# Patient Record
Sex: Female | Born: 1960 | Race: White | Hispanic: No | Marital: Single
Health system: Southern US, Community
[De-identification: ages and names within clinical notes are randomized; demographics above are authoritative.]

---

## 2014-04-07 ENCOUNTER — Inpatient Hospital Stay: Payer: Self-pay | Admitting: Internal Medicine

## 2014-04-07 LAB — CK TOTAL AND CKMB (NOT AT ARMC)
CK, Total: 3011 U/L — ABNORMAL HIGH (ref 26–192)
CK-MB: 9.9 ng/mL — AB (ref 0.5–3.6)

## 2014-04-07 LAB — TROPONIN I
TROPONIN-I: 0.34 ng/mL — AB
Troponin-I: 0.36 ng/mL — ABNORMAL HIGH

## 2014-04-07 LAB — COMPREHENSIVE METABOLIC PANEL
ALK PHOS: 93 U/L
ANION GAP: 9 (ref 7–16)
Albumin: 3.4 g/dL (ref 3.4–5.0)
BUN: 39 mg/dL — ABNORMAL HIGH (ref 7–18)
Bilirubin,Total: 1 mg/dL (ref 0.2–1.0)
CALCIUM: 9.6 mg/dL (ref 8.5–10.1)
CHLORIDE: 107 mmol/L (ref 98–107)
CO2: 25 mmol/L (ref 21–32)
Creatinine: 1.89 mg/dL — ABNORMAL HIGH (ref 0.60–1.30)
GFR CALC AF AMER: 36 — AB
GFR CALC NON AF AMER: 30 — AB
Glucose: 152 mg/dL — ABNORMAL HIGH (ref 65–99)
OSMOLALITY: 294 (ref 275–301)
Potassium: 4.9 mmol/L (ref 3.5–5.1)
SGOT(AST): 81 U/L — ABNORMAL HIGH (ref 15–37)
SGPT (ALT): 67 U/L — ABNORMAL HIGH
SODIUM: 141 mmol/L (ref 136–145)
Total Protein: 7.7 g/dL (ref 6.4–8.2)

## 2014-04-07 LAB — DRUG SCREEN, URINE
Amphetamines, Ur Screen: NEGATIVE (ref ?–1000)
BARBITURATES, UR SCREEN: NEGATIVE (ref ?–200)
BENZODIAZEPINE, UR SCRN: NEGATIVE (ref ?–200)
Cannabinoid 50 Ng, Ur ~~LOC~~: NEGATIVE (ref ?–50)
Cocaine Metabolite,Ur ~~LOC~~: NEGATIVE (ref ?–300)
MDMA (ECSTASY) UR SCREEN: NEGATIVE (ref ?–500)
Methadone, Ur Screen: NEGATIVE (ref ?–300)
Opiate, Ur Screen: POSITIVE (ref ?–300)
Phencyclidine (PCP) Ur S: NEGATIVE (ref ?–25)
TRICYCLIC, UR SCREEN: NEGATIVE (ref ?–1000)

## 2014-04-07 LAB — CBC
HCT: 47.6 % — ABNORMAL HIGH (ref 35.0–47.0)
HGB: 15.8 g/dL (ref 12.0–16.0)
MCH: 30.9 pg (ref 26.0–34.0)
MCHC: 33.1 g/dL (ref 32.0–36.0)
MCV: 93 fL (ref 80–100)
Platelet: 228 10*3/uL (ref 150–440)
RBC: 5.11 10*6/uL (ref 3.80–5.20)
RDW: 13.7 % (ref 11.5–14.5)
WBC: 5.7 10*3/uL (ref 3.6–11.0)

## 2014-04-07 LAB — URINALYSIS, COMPLETE
Glucose,UR: NEGATIVE mg/dL (ref 0–75)
Hyaline Cast: 35
Leukocyte Esterase: NEGATIVE
NITRITE: NEGATIVE
PH: 5 (ref 4.5–8.0)
Protein: 100
Specific Gravity: 1.028 (ref 1.003–1.030)
Squamous Epithelial: NONE SEEN

## 2014-04-07 LAB — CK-MB
CK-MB: 10.1 ng/mL — AB (ref 0.5–3.6)
CK-MB: 13.2 ng/mL — ABNORMAL HIGH (ref 0.5–3.6)
CK-MB: 14 ng/mL — ABNORMAL HIGH (ref 0.5–3.6)

## 2014-04-07 LAB — ETHANOL

## 2014-04-08 DIAGNOSIS — R0602 Shortness of breath: Secondary | ICD-10-CM

## 2014-04-08 LAB — CBC WITH DIFFERENTIAL/PLATELET
Basophil #: 0 10*3/uL (ref 0.0–0.1)
Basophil %: 0.4 %
EOS ABS: 0.1 10*3/uL (ref 0.0–0.7)
Eosinophil %: 1.6 %
HCT: 40.3 % (ref 35.0–47.0)
HGB: 13.7 g/dL (ref 12.0–16.0)
LYMPHS PCT: 16.5 %
Lymphocyte #: 0.8 10*3/uL — ABNORMAL LOW (ref 1.0–3.6)
MCH: 31.3 pg (ref 26.0–34.0)
MCHC: 33.9 g/dL (ref 32.0–36.0)
MCV: 92 fL (ref 80–100)
Monocyte #: 0.8 x10 3/mm (ref 0.2–0.9)
Monocyte %: 15.8 %
NEUTROS PCT: 65.7 %
Neutrophil #: 3.2 10*3/uL (ref 1.4–6.5)
Platelet: 176 10*3/uL (ref 150–440)
RBC: 4.37 10*6/uL (ref 3.80–5.20)
RDW: 13.8 % (ref 11.5–14.5)
WBC: 4.8 10*3/uL (ref 3.6–11.0)

## 2014-04-08 LAB — COMPREHENSIVE METABOLIC PANEL
ALT: 58 U/L
AST: 128 U/L — AB (ref 15–37)
Albumin: 2.7 g/dL — ABNORMAL LOW (ref 3.4–5.0)
Alkaline Phosphatase: 77 U/L
Anion Gap: 7 (ref 7–16)
BUN: 34 mg/dL — ABNORMAL HIGH (ref 7–18)
Bilirubin,Total: 0.8 mg/dL (ref 0.2–1.0)
CHLORIDE: 111 mmol/L — AB (ref 98–107)
CREATININE: 1.41 mg/dL — AB (ref 0.60–1.30)
Calcium, Total: 9.5 mg/dL (ref 8.5–10.1)
Co2: 25 mmol/L (ref 21–32)
GFR CALC AF AMER: 50 — AB
GFR CALC NON AF AMER: 42 — AB
Glucose: 112 mg/dL — ABNORMAL HIGH (ref 65–99)
Osmolality: 293 (ref 275–301)
Potassium: 4.6 mmol/L (ref 3.5–5.1)
SODIUM: 143 mmol/L (ref 136–145)
TOTAL PROTEIN: 6.1 g/dL — AB (ref 6.4–8.2)

## 2014-04-08 LAB — URIC ACID: Uric Acid: 8.6 mg/dL — ABNORMAL HIGH (ref 2.6–6.0)

## 2014-04-08 LAB — CK: CK, Total: 4080 U/L — ABNORMAL HIGH (ref 26–192)

## 2014-04-08 LAB — TROPONIN I
TROPONIN-I: 0.27 ng/mL — AB
Troponin-I: 0.3 ng/mL — ABNORMAL HIGH

## 2014-04-08 LAB — MAGNESIUM: MAGNESIUM: 2.2 mg/dL

## 2014-04-09 LAB — OCCULT BLOOD X 1 CARD TO LAB, STOOL: OCCULT BLOOD, FECES: POSITIVE

## 2014-04-09 LAB — CK: CK, TOTAL: 1830 U/L — AB (ref 26–192)

## 2014-04-09 LAB — CBC WITH DIFFERENTIAL/PLATELET
BASOS ABS: 0.1 10*3/uL (ref 0.0–0.1)
Basophil %: 0.8 %
Eosinophil #: 0.2 10*3/uL (ref 0.0–0.7)
Eosinophil %: 2.3 %
HCT: 38 % (ref 35.0–47.0)
HGB: 12.8 g/dL (ref 12.0–16.0)
LYMPHS ABS: 1 10*3/uL (ref 1.0–3.6)
LYMPHS PCT: 13.8 %
MCH: 30.8 pg (ref 26.0–34.0)
MCHC: 33.6 g/dL (ref 32.0–36.0)
MCV: 92 fL (ref 80–100)
MONOS PCT: 8.7 %
Monocyte #: 0.6 x10 3/mm (ref 0.2–0.9)
Neutrophil #: 5.5 10*3/uL (ref 1.4–6.5)
Neutrophil %: 74.4 %
Platelet: 197 10*3/uL (ref 150–440)
RBC: 4.14 10*6/uL (ref 3.80–5.20)
RDW: 14 % (ref 11.5–14.5)
WBC: 7.5 10*3/uL (ref 3.6–11.0)

## 2014-04-09 LAB — BASIC METABOLIC PANEL
Anion Gap: 6 — ABNORMAL LOW (ref 7–16)
BUN: 34 mg/dL — AB (ref 7–18)
CHLORIDE: 114 mmol/L — AB (ref 98–107)
Calcium, Total: 9.8 mg/dL (ref 8.5–10.1)
Co2: 25 mmol/L (ref 21–32)
Creatinine: 1.37 mg/dL — ABNORMAL HIGH (ref 0.60–1.30)
EGFR (African American): 52 — ABNORMAL LOW
EGFR (Non-African Amer.): 43 — ABNORMAL LOW
Glucose: 145 mg/dL — ABNORMAL HIGH (ref 65–99)
OSMOLALITY: 299 (ref 275–301)
POTASSIUM: 4.4 mmol/L (ref 3.5–5.1)
Sodium: 145 mmol/L (ref 136–145)

## 2014-04-09 LAB — HEMOGLOBIN: HGB: 12.8 g/dL (ref 12.0–16.0)

## 2014-04-10 LAB — COMPREHENSIVE METABOLIC PANEL
ALK PHOS: 82 U/L
ANION GAP: 8 (ref 7–16)
Albumin: 2.2 g/dL — ABNORMAL LOW (ref 3.4–5.0)
BUN: 33 mg/dL — ABNORMAL HIGH (ref 7–18)
Bilirubin,Total: 0.8 mg/dL (ref 0.2–1.0)
CO2: 25 mmol/L (ref 21–32)
Calcium, Total: 9.6 mg/dL (ref 8.5–10.1)
Chloride: 117 mmol/L — ABNORMAL HIGH (ref 98–107)
Creatinine: 1.26 mg/dL (ref 0.60–1.30)
EGFR (African American): 57 — ABNORMAL LOW
GFR CALC NON AF AMER: 47 — AB
Glucose: 133 mg/dL — ABNORMAL HIGH (ref 65–99)
OSMOLALITY: 307 (ref 275–301)
POTASSIUM: 4.1 mmol/L (ref 3.5–5.1)
SGOT(AST): 58 U/L — ABNORMAL HIGH (ref 15–37)
SGPT (ALT): 48 U/L
SODIUM: 150 mmol/L — AB (ref 136–145)
Total Protein: 6.1 g/dL — ABNORMAL LOW (ref 6.4–8.2)

## 2014-04-10 LAB — URINALYSIS, COMPLETE
Bilirubin,UR: NEGATIVE
Glucose,UR: NEGATIVE mg/dL (ref 0–75)
Ketone: NEGATIVE
Leukocyte Esterase: NEGATIVE
NITRITE: NEGATIVE
Ph: 6 (ref 4.5–8.0)
RBC,UR: 4605 /HPF (ref 0–5)
Specific Gravity: 1.029 (ref 1.003–1.030)
Squamous Epithelial: NONE SEEN
WBC UR: 86 /HPF (ref 0–5)

## 2014-04-10 LAB — PHOSPHORUS: PHOSPHORUS: 2.1 mg/dL — AB (ref 2.5–4.9)

## 2014-04-10 LAB — CK: CK, TOTAL: 1152 U/L — AB (ref 26–192)

## 2014-04-10 LAB — MAGNESIUM: Magnesium: 2.4 mg/dL

## 2014-04-10 LAB — HEMOGLOBIN: HGB: 12.7 g/dL (ref 12.0–16.0)

## 2014-04-11 LAB — CBC WITH DIFFERENTIAL/PLATELET
BASOS ABS: 0 10*3/uL (ref 0.0–0.1)
BASOS PCT: 0.5 %
Eosinophil #: 0.3 10*3/uL (ref 0.0–0.7)
Eosinophil %: 3.8 %
HCT: 36.8 % (ref 35.0–47.0)
HGB: 12.3 g/dL (ref 12.0–16.0)
LYMPHS ABS: 0.8 10*3/uL — AB (ref 1.0–3.6)
Lymphocyte %: 8.4 %
MCH: 31 pg (ref 26.0–34.0)
MCHC: 33.5 g/dL (ref 32.0–36.0)
MCV: 93 fL (ref 80–100)
Monocyte #: 1.2 x10 3/mm — ABNORMAL HIGH (ref 0.2–0.9)
Monocyte %: 13.6 %
Neutrophil #: 6.6 10*3/uL — ABNORMAL HIGH (ref 1.4–6.5)
Neutrophil %: 73.7 %
Platelet: 174 10*3/uL (ref 150–440)
RBC: 3.97 10*6/uL (ref 3.80–5.20)
RDW: 14.1 % (ref 11.5–14.5)
WBC: 9 10*3/uL (ref 3.6–11.0)

## 2014-04-11 LAB — PHOSPHORUS: Phosphorus: 2.9 mg/dL (ref 2.5–4.9)

## 2014-04-11 LAB — BASIC METABOLIC PANEL
Anion Gap: 9 (ref 7–16)
BUN: 29 mg/dL — ABNORMAL HIGH (ref 7–18)
CALCIUM: 9.7 mg/dL (ref 8.5–10.1)
Chloride: 116 mmol/L — ABNORMAL HIGH (ref 98–107)
Co2: 25 mmol/L (ref 21–32)
Creatinine: 0.94 mg/dL (ref 0.60–1.30)
EGFR (African American): >60
Glucose: 108 mg/dL — ABNORMAL HIGH (ref 65–99)
OSMOLALITY: 304 (ref 275–301)
Sodium: 150 mmol/L — ABNORMAL HIGH (ref 136–145)

## 2014-04-11 LAB — POTASSIUM: Potassium: 3.8 mmol/L (ref 3.5–5.1)

## 2014-04-12 LAB — BASIC METABOLIC PANEL
ANION GAP: 8 (ref 7–16)
BUN: 23 mg/dL — ABNORMAL HIGH (ref 7–18)
CO2: 26 mmol/L (ref 21–32)
CREATININE: 0.95 mg/dL (ref 0.60–1.30)
Calcium, Total: 9.3 mg/dL (ref 8.5–10.1)
Chloride: 113 mmol/L — ABNORMAL HIGH (ref 98–107)
EGFR (African American): >60
EGFR (Non-African Amer.): >60
Glucose: 137 mg/dL — ABNORMAL HIGH (ref 65–99)
OSMOLALITY: 298 (ref 275–301)
Potassium: 4 mmol/L (ref 3.5–5.1)
Sodium: 147 mmol/L — ABNORMAL HIGH (ref 136–145)

## 2014-04-12 LAB — STOOL CULTURE

## 2014-04-12 LAB — MAGNESIUM: Magnesium: 2.2 mg/dL

## 2014-04-12 LAB — CULTURE, BLOOD (SINGLE)

## 2014-04-12 LAB — PHOSPHORUS: Phosphorus: 2.7 mg/dL (ref 2.5–4.9)

## 2014-04-13 LAB — PHOSPHORUS: PHOSPHORUS: 2.4 mg/dL — AB (ref 2.5–4.9)

## 2014-04-13 LAB — BASIC METABOLIC PANEL
Anion Gap: 12 (ref 7–16)
BUN: 14 mg/dL (ref 7–18)
Calcium, Total: 9.1 mg/dL (ref 8.5–10.1)
Chloride: 118 mmol/L — ABNORMAL HIGH (ref 98–107)
Co2: 20 mmol/L — ABNORMAL LOW (ref 21–32)
Creatinine: 0.82 mg/dL (ref 0.60–1.30)
EGFR (African American): >60
Glucose: 102 mg/dL — ABNORMAL HIGH (ref 65–99)
Osmolality: 299 (ref 275–301)
POTASSIUM: 4.3 mmol/L (ref 3.5–5.1)
SODIUM: 150 mmol/L — AB (ref 136–145)

## 2014-04-13 LAB — CLOSTRIDIUM DIFFICILE(ARMC)

## 2014-04-13 LAB — MAGNESIUM: Magnesium: 2.1 mg/dL

## 2014-04-14 LAB — BASIC METABOLIC PANEL
Anion Gap: 8 (ref 7–16)
BUN: 12 mg/dL (ref 7–18)
Calcium, Total: 9.5 mg/dL (ref 8.5–10.1)
Chloride: 112 mmol/L — ABNORMAL HIGH (ref 98–107)
Co2: 28 mmol/L (ref 21–32)
Creatinine: 0.83 mg/dL (ref 0.60–1.30)
Glucose: 114 mg/dL — ABNORMAL HIGH (ref 65–99)
OSMOLALITY: 295 (ref 275–301)
Potassium: 3.2 mmol/L — ABNORMAL LOW (ref 3.5–5.1)
Sodium: 148 mmol/L — ABNORMAL HIGH (ref 136–145)

## 2014-04-14 LAB — MAGNESIUM: Magnesium: 2.2 mg/dL

## 2014-04-14 LAB — PHOSPHORUS: Phosphorus: 2.3 mg/dL — ABNORMAL LOW (ref 2.5–4.9)

## 2014-04-15 LAB — BASIC METABOLIC PANEL
Anion Gap: 10 (ref 7–16)
BUN: 15 mg/dL (ref 7–18)
CHLORIDE: 110 mmol/L — AB (ref 98–107)
CREATININE: 0.78 mg/dL (ref 0.60–1.30)
Calcium, Total: 9.9 mg/dL (ref 8.5–10.1)
Co2: 24 mmol/L (ref 21–32)
EGFR (Non-African Amer.): >60
Glucose: 140 mg/dL — ABNORMAL HIGH (ref 65–99)
Osmolality: 290 (ref 275–301)
Potassium: 4 mmol/L (ref 3.5–5.1)
SODIUM: 144 mmol/L (ref 136–145)

## 2014-04-15 LAB — MAGNESIUM: MAGNESIUM: 2.3 mg/dL

## 2014-04-15 LAB — CULTURE, BLOOD (SINGLE)

## 2014-07-19 NOTE — Consult Note (Signed)
PATIENT NAMELAEL, [name] MR#:  [id number] DATE OF BIRTH:  1960/05/12  DATE OF CONSULTATION:  04/10/2014  REFERRING PHYSICIAN:   CONSULTING PHYSICIAN:  [name] Deem, May  REASON FOR CONSULTATION: Rectal bleeding.   HISTORY OF PRESENT ILLNESS: Ms. [name] is a 54 year old Caucasian female who was admitted with altered mental status and respiratory failure from likely drug overdose and possible suicide attempt, and possible aspiration. Her admission is complicated by several other factors, to include acute severe renal failure, metabolic encephalopathy as well as a possible new right-sided stroke. It was noted yesterday that there were some "specks of blood" recorded in her stool. She has had no large amount of rectal bleeding. Her hemogram has been stable.   The patient herself is unable to give history as she is sedated and intubated. She did fail a weaning trial. Nursing reports no gross rectal bleeding, although there was a light pink watery coloration when they had changed an earlier stool. There has been no melena per se.   PAST MEDICAL HISTORY:  1.  Anxiety.  2.  Depression.  3.  Suicide attempts 30 years ago with wrist cutting.  4.  History of irritable bowel syndrome.  5.  History of diverticulitis.  6.  Polycystic ovarian syndrome.  7.  History of gallstones.  8.  Hysterectomy.  9.  Cholecystectomy.   SOCIAL HISTORY: Per admission history and physical, she does not smoke. Occasional alcohol use. Marijuana in the past. She has had a lot of social stressors. Possible remote cocaine use.   GASTROINTESTINAL FAMILY HISTORY: Stated as not remarkable. It is of note, the patient did take ranitidine 150 mg twice a day as an outpatient.   PHYSICAL EXAMINATION:  VITAL SIGNS: Temperature 101.9, pulse 58, respirations 27, blood pressure 112/75, pulse oximetry 96%.  GENERAL: She is a 54 year old Caucasian female, intubated, sedated, not responsive to examiner.  HEENT:  Normocephalic, atraumatic.  EYES: Anicteric.  NOSE: Septum midline.  OROPHARYNX: ET tube in place.  NECK: No apparent JVD.  HEART: Regular rate and rhythm.  LUNGS: Clear on the right. Very decreased breath sounds on the left.  ABDOMEN: Obese, nondistended. Soft. Bowel sounds are positive. There is no apparent organomegaly or masses felt.  EXTREMITIES: No clubbing, cyanosis, or edema.  NEUROLOGICAL: Per neuro exam.  ANORECTAL: On turning the patient, there was a large amount of soft, minimally formed, greenish stool. There is no gross bleeding; however, it was Hemoccult positive to check. She has multiple external skin tags, possible hemorrhoid.   LABORATORY, DIAGNOSTIC, AND RADIOLOGICAL DATA INCLUDED: Laboratories today showed a glucose of 133, BUN 33, creatinine 1.26, sodium 150, potassium of 4.1, chloride 117, bicarbonate 25, osmolality 307, calcium 9.6, phosphorus 2.1, magnesium 2.4. Hepatic profile showing a total protein of 6.1, albumin 2.2, total bilirubin 0.8, alkaline phosphatase 82, AST 58, ALT 48. She had a CK total today of 1152. This was 4080 two days ago, and on admission was a little over 3000. She has had troponin I x 3 at 0.34 0.30 and 0.27. She had a urine drug screen that was positive for opiates. Her last hemogram was yesterday with a white count of 7.5. Hemoglobin and hematocrit of 12.8/38.0, platelet count of 197,000. MCV was 92. She has had 2 hemoglobins drawn since yesterday morning; these being 12.8 and 12.7 respectively, these being quite stable. Hemogram: There was stool culture that has been obtained, currently being held. She did have an occult blood done of the fecal material that was positive.  ABG pH yesterday was 7.5. She had a CT scan of the head without contrast showing a new, ill-defined, low density lesion in the inferior portion of the right frontal lobe concerning for acute infarction.  She had a KUB done 2 days ago for NG tube placement that was unremarkable. She  had a portable chest film 2 days ago that showed a bibasilar atelectasis.   ASSESSMENT AND PLAN:  1.  The patient does show a Hemoccult positive stool. There was a fair amount of very thinnish, watery type material that was pink on the bed sheet; however, stool  does not have melena. It is heme-positive. Of note, the patient has apparently been taking an H2 receptor antagonist at home, referring some type of dyspepsia or other upper GI symptomatology. She has been stable from a standpoint of her hemoglobin. There are multiple medical issues related with her respiratory failure and new stroke.   RECOMMENDATION:  1.  Would change her Pepcid that she has been placed on for stress ulcer prophylaxis to an IV PPI, as this will be much more effective for her. Particularly in light of the fact that she likely had some type of dyspepsia previously.  2.  In regard to the finding of possible anal outlet bleeding, would start treating her external hemorrhoids with some Analpram 2.5% cream applied externally t.i.d. for 10 days.  3.  Recommend daily CBC.  4.  We will follow with you.    ____________________________ [name] DeemMartin U. Skulskie, May mus:MT D: 04/10/2014 15:47:55 ET T: 04/10/2014 16:04:05 ET JOB#: [id number]  cc: [name] DeemMartin U. Skulskie, May, <Dictator> [name] DeemMARTIN U SKULSKIE May ELECTRONICALLY SIGNED 04/13/2014 13:41

## 2014-07-19 NOTE — Consult Note (Signed)
PATIENT NAMLyda Jester:  May, [name] MR#:  [id number] DATE OF BIRTH:  [birth year]  DATE OF CONSULTATION:  04/13/2014  REFERRING PHYSICIAN:   CONSULTING PHYSICIAN:  Pauletta BrownsYuriy Chea Malan, MD  SUBJECTIVE: A 54 year old female with past medical history of anxiety, depression, history of suicidal ideation in the past admitted with altered mental status, suspected overdose use. The patient is status post extubation. EEG done during the course showing slowing signs of a triphasic fit wave. The patient is status post CAT scan of the head that showed a right frontal inferior lobe infarct. The patient is extubated and on a regular diet, following commands.   NEUROLOGIC EVALUATION: The patient alert, awake, oriented to time, place, location and the reason she is in the hospital. Facial sensation intact. Facial motor is intact. Tongue is midline. Uvula elevates symmetrically. Shoulder shrug intact. Motor strength, slight weakness actually on the right upper extremity compared to the left upper extremity. Generalized weakness bilateral lower extremities. Sensation intact to light touch and temperature. Coordination: Finger-to-nose intact.   IMPRESSION: A 54 year old morbidly obese female admitted with respiratory failure, acute metabolic encephalopathy and rhabdomyolysis, questionable overdose. The patient is status post extubation, following commands, on a regular diet. Current complaint is only throat pain.   PLAN: I agree with discontinuing antibiotics. Renal failure improved. CAT scan discussed with the patient's family at bedside. On aspirin. The patient has no weakness on the left side. She is actually complaining of minor weakness in the right upper extremity. Tolerating a regular diet. Will transfer out of ICU when appropriate. No further neurological intervention at this point. Please call with any questions. This case was discussed with the patient and the patient's family at bedside.    ____________________________ Pauletta BrownsYuriy Danea Manter, MD yz:TT D: 04/13/2014 13:01:00 ET T: 04/13/2014 14:17:11 ET JOB#: [id number]  cc: Pauletta BrownsYuriy Janiel Derhammer, MD, <Dictator> Pauletta BrownsYURIY Barre Aydelott MD ELECTRONICALLY SIGNED 04/28/2014 12:05

## 2014-07-19 NOTE — Consult Note (Signed)
PATIENT NAMLyda Jester:  May, [name] MR#:  [id number] DATE OF BIRTH:  February 02, 1961  DATE OF CONSULTATION:  04/14/2014  ATTENDING PHYSICIAN:  Herschell Dimesichard J. [name], MD  CONSULTING PHYSICIAN:  Davina Pokehapman T. Blayke Pinera, MD  REASON FOR CONSULTATION:  Hoarseness and stridor.   HISTORY OF PRESENT ILLNESS:  This is a 54 year old female who was admitted on the 19th following what sounds like an accidental overdose according to the patient. She was intubated in the Emergency Room on the 19th. According to the notes which I can see, she was extubated on the morning of the 25th as best I can tell, last being seen by the ICU Dr. Belia HemanKasa, and was transferred to the floor. She was noted today to have intermittent hoarseness as well as intermittent stridor. She has never had any issues before. She does recall some during her intubation period, that they had difficulty suctioning her from time to time, and according to her, it was a very rough ordeal for her to be extubated.   PAST MEDICAL HISTORY:  Significant for anxiety and depression. She had a suicide attempt approximately 30 years ago. She has irritable bowel, diverticulitis, polycystic ovarian disease, and a gallstone.   PAST SURGICAL HISTORY:  Significant for hysterectomy and cholecystectomy.   ALLERGIES:  She has no known drug allergies.   HOME MEDICATIONS:  She was on home medications according to her pharmacist.    REVIEW OF SYSTEMS:  She has been complaining of some right ear pain, as well as hoarseness.   PHYSICAL EXAMINATION:  On exam today, both ears were completely clear. Middle ear spaces were normal. The anterior nose was widely patent. The oral cavity, oropharynx, and palpation of the neck were benign. Topical anesthetic of phenylephrine/lidocaine, approximately 15 drops, was placed within each nostril. A flexible fiber-optic laryngoscope was introduced into the airway. There was a significant gag reflex; however, with some coaching, she was able to overcome  this. Examination of the larynx did show some bowing of the vocal folds consistent with recent intubation. She also had some mild sluggishness of the left vocal fold, but there was significant edema in the immediate subglottis. It was a very difficult exam due to her gag and coughing, but it appeared that that was the biggest area of narrowing. By my best estimation, it was less than a 50% narrowing in the subglottis; however, I was not able to assess much further than approximately a centimeter below the vocal folds.   IMPRESSION:  Mild stridor and hoarseness likely due to postintubation injury. I would recommend intravenous steroids and Decadron 10 mg every 8 hours for the next 24 hours. As her breathing improves, would switch to a 12-day double-strength Sterapred taper, which she can be discharged to home on. I would recommend her to follow up with us in one week's time for repeat endoscopy or sooner should her symptoms worsen. I have voiced this to her. I will speak with the hospitalist on-call about this.    ____________________________ Davina Pokehapman T. Walta Bellville, MD ctm:nb D: 04/14/2014 17:25:12 ET T: 04/14/2014 22:02:20 ET JOB#: [id number]  cc: Davina Pokehapman T. Callen Vancuren, MD, <Dictator> Davina PokeHAPMAN T Yovanna Cogan MD ELECTRONICALLY SIGNED 05/15/2014 7:54

## 2014-07-19 NOTE — Consult Note (Signed)
PATIENT NAMLyda May:  [name], [name] MR#:  [id number] DATE OF BIRTH:  [birth year]  DATE OF CONSULTATION:  04/09/2014  REFERRING PHYSICIAN:   CONSULTING PHYSICIAN:  Pauletta BrownsYuriy Katianne Barre, MD  REASON FOR CONSULTATION:  Encephalopathy.   HISTORY OF PRESENT ILLNESS: This is a 54 year old female with known history of anxiety, depression, suicidal ideation in the past, admitted with altered mental status.  Pt witness for about 36 hours, questionable admission for overdose.  Urine toxicology screen was positive for opiates. The patient has suspected aspiration pneumonia on antibiotics. The patient was found to have acute rhabdomyolysis on IV fluids and that is improving. The patient is currently on the ventilator.   PAST MEDICAL HISTORY: Anxiety, depression, suicidal attempt 30 years ago, irritable bowel syndrome, diverticulitis, polycystic ovarian disease, gallstones.   PAST SURGICAL HISTORY: Hysterectomy, cholecystectomy.   ALLERGIES: No known drug allergies.   HOME MEDICATIONS: Difficult to verify.   SOCIAL HISTORY: No smoking. Occasional alcohol use.   REVIEW OF SYSTEMS: Unable to obtain.   NEUROLOGIC EVALUATION: The patient is sedated. She is on Precedex. Does not follow any commands. Opens her eyes to painful stimuli. Minimal withdrawal of bilateral upper and lower extremities. Coordination, sensation, gait could not be assessed.   LABORATORY WORKUP: Reviewed. CT head on admission did not show any acute abnormalities.   IMPRESSION: A 54 year old female with questionable suicide ideation/attempt with medication overdose. Neurologic consultation for encephalopathy. Encephalopathy is possibly multifactorial that includes rhabdomyolysis with renal failure, aspiration pneumonia, overdose, at the same time cannot rule out anoxic injury.   PLAN:  1.  I agree with obtaining CT of brain today to look for any loss of gray-white matter differentiation.  2.  I will order EEG for tomorrow.  3.  Agree with use  of p.r.n. benzodiazepines, May consider using longer acting benzodiazepine like clonazepam 0.5-1 mg q. 12 hours instead of the Xanax.  4.  Continue Precedex as needed, wean as per primary team.   Thank you. Please call with any questions.      ____________________________ Pauletta BrownsYuriy Laquitha Heslin, MD yz:bu D: 04/09/2014 15:33:06 ET T: 04/09/2014 15:57:10 ET JOB#: [id number]  cc: Pauletta BrownsYuriy Korrie Hofbauer, MD, <Dictator> Pauletta BrownsYURIY Lizzett Nobile MD ELECTRONICALLY SIGNED 04/28/2014 12:05

## 2014-07-19 NOTE — Consult Note (Signed)
Brief Consult Note: Diagnosis: rectal bleeding.   Patient was seen by consultant.   Consult note dictated.   Recommend further assessment or treatment.   Orders entered.   Comments: Please see full GI consult [phone]#[id number].   Patietn admitted with AMS, possible right eye and respiratory failure.  Course complicated with ARF and new CVA.  Stool is not melena, actually medium green, but heme positive in the setting of multiple external skin tags.  Recommend starting treatment for inflammed rectal skin tags, change H2RA to ppi as she was taking a bid H2RA at home, would likely need more effective stress ulcer prophylaxis in setting of CVA and possible o/p  issue.  Daily hemoglobin. Following.  Electronic Signatures: Barnetta ChapelSkulskie, Jodene Polyak (MD)  (Signed 22-Jan-16 15:52)  Authored: Brief Consult Note   Last Updated: 22-Jan-16 15:52 by Barnetta ChapelSkulskie, Verta Riedlinger (MD)

## 2014-07-19 NOTE — Consult Note (Signed)
Brief Consult Note: Diagnosis: hoarseness/stridor from recent intubation.   Patient was seen by consultant.   Consult note dictated.   Orders entered.   Discussed with Attending MD.   Comments: mild left vc paresis with bowing of cords/also mild/moderate subglottic narrowing likely related to intubation.  Recommend IV decadron for remainder of hospital stay (she says 2 more days) then discharge to home on 12 day double strength steripred taper.  Room humidifier.  Electronic Signatures: Davina PokeMcqueen, Johnta Couts T (MD)  (Signed 26-Jan-16 17:28)  Authored: Brief Consult Note   Last Updated: 26-Jan-16 17:28 by Davina PokeMcqueen, Giovan Pinsky T (MD)

## 2014-07-19 NOTE — H&P (Signed)
PATIENT NAMETASHANTI, May MR#:  [id number] DATE OF BIRTH:  [birth year]  DATE OF ADMISSION:  04/07/2014  PRIMARY CARE PHYSICIAN: [facility]   REQUESTING PHYSICIAN: Daryel November, MD  CHIEF COMPLAINT: Altered mental status.   HISTORY OF PRESENT ILLNESS: The patient is a 54 year old female with known history of anxiety and depression, also suicidal ideation in the past, last one being 30 years ago, is being admitted for altered mental status/metabolic encephalopathy, likely due to drug overdose. 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 This morning she did not show up for work so her employer called her son. As per son, the patient had not been responding since yesterday afternoon to any text or phone calls, but this morning's call made him worried. He went to her home and she was sitting in her chair, would just open eyes but nonresponsive, was not following any commands. He called EMS and brought her to the Emergency Department. In the ER, she was intubated for respiratory protection as there was some concern that she likely also vomited during the episode. Her pH in the Emergency Room was found to be 7.29 with pO2 72. She is unable to provide any history as she is on ventilator. Most of the information is per the ED physician, medical records and per son who is at the bedside. Her son reports that there was some empty bottles of hydrocodone in the house, but otherwise house was spotless. He cannot imagine anything else happening other than drug overdose.  PAST MEDICAL HISTORY:  1.  Anxiety.  2.  Depression.  3.  Suicidal attempt 30 years ago when she cut her wrist. Most of her care has been at [facility] since 2008. Before that she was in [place].  4.  Irritable bowel syndrome.  5.  Diverticulitis.  6.  Polycystic ovarian disease.  7.  Gallstone.  PAST SURGICAL HISTORY: 1.  Hysterectomy.  2.  Cholecystectomy.   ALLERGIES: No known drug allergies.  HOME MEDICATIONS:  None. Pharmacist tech verified the nearest possible pharmacy, which was [facility], and she has not had any medication picked up since last April.   SOCIAL HISTORY: No smoking. Occasional alcohol. She has used marijuana in the recent past. She has been struggling with a lot of mental problems. She has been divorced. She has also dated some other friend in [place] and has not had good relation and has had rough time as per her son. 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 Her son also reports her using cocaine long back, maybe about 30 years ago, but nothing since then.   FAMILY HISTORY: None remarkable. Son does not recall any other problems other than psychiatric problems in the family.   REVIEW OF SYSTEMS: Unobtainable due to her being on vent.   PHYSICAL EXAMINATION: VITAL SIGNS: Temperature 99.9, heart rate 114 per minute, respirations 24 per minute, blood pressure 153/97. She is saturating 88% on room air. Is on full assist control ventilator in the Emergency Department.  GENERAL: The patient is a 54 year old female lying in the bed, critically sick, on ventilator.  EYES: Pupils pinpoint, minimally reactive to light and accommodation. No scleral icterus. Extraocular muscles intact.  HEENT: Head atraumatic, normocephalic. Oropharynx and nasopharynx dry and clear. Endotracheal tube in place.  NECK: Supple. No jugular venous distention. No thyroid enlargement or tenderness. Trachea midline.  LUNGS: Clear to auscultation bilaterally. No wheezing, rales, rhonchi or  crepitation.  CARDIOVASCULAR: S1 and S2  normal, tachycardic. No murmurs, rubs or gallop.  ABDOMEN: Soft, nontender, nondistended. Obesity present. No organomegaly or mass.  EXTREMITIES: No pedal edema, cyanosis or clubbing.  NEUROLOGIC: Unable to evaluate as she is sedated for ventilation.  MUSCULOSKELETAL: No joint effusion or tenderness.  SKIN: No obvious rash, lesion or ulcer.   DIAGNOSTIC DATA: Laboratory panel:  Normal BMP, except BUN of 39, creatinine 1.89. Blood sugar 152. Normal liver function tests, except AST of 81, ALT 67. CK 3011. MB fraction 9.9. Troponin 0.36. Urine tox positive for opiate. CBC within normal limits. UA is negative, except trace bacteria.   ABG showed pH of 7.29, pCO2 47, pO2 72, bicarb 22.6.   Chest x-ray in the Emergency Department showed possible infiltrate at the bases, possible infiltrate at the right hilum.   Repeat chest x-ray showed satisfactory endotracheal tube position with improved lung volume and improved aeration at the lung bases. Persistent asymmetric density in the right medial base worrisome for infiltrate.   CT scan of the head showed no acute intracranial abnormality.   EKG shows sinus tach, incomplete right bundle branch block. No major ST-T changes.   IMPRESSION AND PLAN: 1.  Acute hypoxic respiratory failure, likely secondary to aspiration pneumonia from drug overdose. We will continue full vent control, consult pulmonary for vent management. We will admit to critical care unit. 2.  Possible aspiration pneumonia. As she did vomit and aspirate contained vomit, also confirmed on chest x-ray, will continue broad spectrum antibiotic, consult pulmonary.  3.  Elevated troponin, likely due to supply/demand ischemia. We will get 2 more sets of troponins to rule her out. 4.  Acute renal failure, likely prerenal. Cannot rule out acute tubular necrosis at this time. Avoid any nephrotoxin. Monitor her with IV fluid hydration.  5.  Anxiety/depression. Consider psychiatry consult once she is extubated. She may need IVC (involuntary commitment) papers if she does not cooperate.   CODE STATUS: FULL code.   CRITICAL CARE TIME SPENT: 55 minutes.  ____________________________ [name]. Sherryll BurgerShah, MD vss:sb D: 04/07/2014 14:04:06 ET T: 04/07/2014 14:47:53 ET JOB#: [id number]  cc: Nadya Hopwood S. Sherryll BurgerShah, MD, <Dictator> [facility] [name] [facility] MD ELECTRONICALLY SIGNED  04/09/2014 10:13

## 2014-07-19 NOTE — Consult Note (Signed)
Chief Complaint:  Subjective/Chief Complaint seen for rectal bleeding.  no recurrent bleeding noted, brown stool. Patietn continues on ventilator/sedated.   VITAL SIGNS/ANCILLARY NOTES: **Vital Signs.:   23-Jan-16 07:00  Temperature Temperature (F) 99  Celsius 37.2  Temperature Source rectal  Pulse Pulse 53  Respirations Respirations 19  Systolic BP Systolic BP 967  Diastolic BP (mmHg) Diastolic BP (mmHg) 88  Mean BP 105  BP Source  if not from Vital Sign Device non-invasive  Pulse Ox % Pulse Ox % 19  Pulse Ox Activity Level  At rest  Oxygen Delivery Ventilator Assisted    10:00  Pulse Pulse 53  Respirations Respirations 16  Systolic BP Systolic BP 893  Diastolic BP (mmHg) Diastolic BP (mmHg) 79  Mean BP 94  BP Source  if not from Vital Sign Device non-invasive  Pulse Ox % Pulse Ox % 16  Pulse Ox Activity Level  At rest  Oxygen Delivery Ventilator Assisted   Brief Assessment:  GEN obese   Cardiac Regular   Respiratory clear BS   Gastrointestinal details normal Soft  Nondistended  Bowel sounds normal   Lab Results: Routine Chem:  23-Jan-16 04:02   Glucose, Serum  108  BUN  29  Creatinine (comp) 0.94  Sodium, Serum  150  Chloride, Serum  116  CO2, Serum 25  Calcium (Total), Serum 9.7  Anion Gap 9  Osmolality (calc) 304  eGFR (African American) >60  eGFR (Non-African American) >60 (eGFR values <41m/min/1.73 m2 may be an indication of chronic kidney disease (CKD). Calculated eGFR, using the MRDR Study equation, is useful in  patients with stable renal function. The eGFR calculation will not be reliable in acutely ill patients when serum creatinine is changing rapidly. It is not useful in patients on dialysis. The eGFR calculation may not be applicable to patients at the low and high extremes of body sizes, pregnant women, and vegetarians.)  Potassium, Serum 3.8 (Result(s) reported on 11 Apr 2014 at 0[facility])  Phosphorus, Serum 2.9 (Result(s) reported on 11 Apr 2014 at 0[facility])  Routine Hem:  23-Jan-16 04:02   WBC (CBC) 9.0  RBC (CBC) 3.97  Hemoglobin (CBC) 12.3  Hematocrit (CBC) 36.8  Platelet Count (CBC) 174  MCV 93  MCH 31.0  MCHC 33.5  RDW 14.1  Neutrophil % 73.7  Lymphocyte % 8.4  Monocyte % 13.6  Eosinophil % 3.8  Basophil % 0.5  Neutrophil #  6.6  Lymphocyte #  0.8  Monocyte #  1.2  Eosinophil # 0.3  Basophil # 0.0 (Result(s) reported on 11 Apr 2014 at 06:01AM.)   Assessment/Plan:  Assessment/Plan:  Assessment 1) rectal bleeding/heme positive.  possible anal outlet source/skin tags/hemorrhoids.  No evidence of significant bleeding recurretn.   Of note patieht show hematuria, possible contaminant of hemoccult.  2) acute respiratory failure, overdose, new CVA.   Plan 1) continue external hemorrhoid cream.  continue ppi as stress ulcer prophylaxis.  Will sign off, reconsult if needed.   Electronic Signatures: SLoistine Simas(MD)  (Signed 23-Jan-16 13:21)  Authored: Chief Complaint, VITAL SIGNS/ANCILLARY NOTES, Brief Assessment, Lab Results, Assessment/Plan   Last Updated: 23-Jan-16 13:21 by SLoistine Simas(MD)

## 2014-07-19 NOTE — Discharge Summary (Signed)
PATIENT NAMLyda May:  May, [name] MR#:  [id number] DATE OF BIRTH:  May 09, 1960  DATE OF ADMISSION:  04/07/2014 DATE OF DISCHARGE:  04/16/2014  [facility]YSIC[facility].   DISCHARGE DIAGNOSES:  1.  Acute encephalopathy due to overdose.  2.  Acute respiratory failure requiring intubation to protect airways.  3.  Stridor and vocal cord trauma with intubation and subsequent extubation.  4.  Aspiration pneumonia.  5.  Acute renal failure from acute tubular necrosis and rhabdomyolysis.  6. >.  7.  Overdose.  8.  Cerebrovascular accident.  9.  Hematuria due to Foley.  10.  Depression.   HOSPITAL COURSE: Please refer to H and P done by the admitting physician. The patient was admitted on 04/07/2014, came in with altered mental status. She had ingested multiple medications and she tried to throw up over ingested medications to prevent ingestion, however, the medications were absorbed and the patient was intubated due to decrease in responsiveness. She was also noticed to have ATN and acute renal failure as well as rhabdomyolysis. The patient was subsequently extubated. She was continued to be treated with IV fluids with normalization of her renal function. The patient once she was extubated was seen by psychiatry, Dr. Toni Amendlapacs, on multiple occasions. He did not feel she was suicidal. He  recommended that the patient to be treated as an outpatient. She was cleared to be discharged. Also she was noted to have an acute right-sided CVA on MRI of the brain. She did have carotid Dopplers, which were negative for obstruction. Echo without any significant evidence of thrombus. The patient received physical therapy. Initially, there was concern that she may need rehab, however, as her mental status improved  she was able to ambulate without any difficulties.   Today Dr. [name] who saw the patient up till yesterday, plan was for her to be discharged today. I was called by the family, her son and her  daughter-in-law, with the concern that her disposition was not appropriate. They felt that she needed further psychiatric treatment. I discussed the case with Dr. Toni Amendlapacs who urgently came and spoke to the son and daughter-in-law. They were very upset with this situation and disposition and they left with taking the patient's keys with them. It was explained to them that the patient did not qualify for rehab and from a psychiatric standpoint she was not suicidal and therefore she was stable for discharge.   The patient also was noted to have stridor, which was felt to be due to intubation. She was seen by Dr. Jenne CampusMcQueen who recommended Decadron and steroid taper as well as nebulizers. There was concern that there was some aspiration pneumonia, which she was treated with Unasyn. She is asymptomatic. The patient also had some , but this resolved with appropriate fluid adjustment. The patient also had a stroke noticed. She was seen by Dr. Loretha BrasilZeylikman who recommended aspirin. Her echocardiogram and carotid Dopplers were nonrevealing. She also had hematuria that was felt to be due to trauma.   PERTINENT LABORATORIES AND EVALUATIONS: Troponin was 0.30. CPK 4080,  uric acid 8.6, magnesium 2.2, glucose 112, BUN 34, creatinine 1.41, sodium 143, CO2 25, calcium 9.5. LFTs showed AST was slightly elevated at 128. WBC 4.8, hemoglobin 13.7, platelet count was 176. Chest x-ray: Low lung volumes. Ultrasound of the carotids negative. CT scan of the head showed new ill defined low density involving the inferior portion of the right frontal concerning for acute infarct. Blood cultures negative.  DISCHARGE ACTIVITY: As tolerated.   DISCHARGE REFERRAL: Outpatient PT.   DISCHARGE MEDICATIONS: Ranitidine 150 one tab p.o. b.i.d., dexamethasone 4 mg 1 tab p.o. b.i.d. x 3 days, then half tab p.o. b.i.d. x 3, then half tab p.o. daily x 3 days, hydrocortisone application rectally 3 times a day, bupropion 150 daily, fluoxetine 20 daily,   40 at bedtime, aspirin 81 one tab p.o. daily, albuterol/ipratropium 3 mL q. 6 hours inhalation.   DIET: Low-sodium, low-fat, low-cholesterol.   ACTIVITY: As tolerated.   FOLLOWUP: With primary MD in 1-2 weeks.  Outpatient psychiatry followup, her case manager to refer local psychiatrist for the patient.    TIME SPENT: 45 minutes.    ____________________________ [name] [name] Katz, MD shp:AT D: 04/16/2014 20:55:57 ET T: 04/17/2014 04:46:21 ET JOB#: [id number]  cc: Lenis Nettleton H. [name] Katz, MD, <Dictator> Charise Carwin MD ELECTRONICALLY SIGNED 04/20/2014 8:56

## 2014-07-19 NOTE — Consult Note (Signed)
Psychiatry: PAtient seen for followup. Patient with no new complaint. Mood reported as better. Denies any suicidal ideation or wish to harm self. Not reporting psychosis. awake and alert and interactive. Eye contact good. Speach normal. Appropriate affect. Denies any suicidal ideation. Agrees to outpatient treatment plan as discussed previously. Case discussed with nurse and hospitalist who agree with assessment and treatment plan. and daughter-in-law here this morning with very different affect than on prior interaction. They presented as very angry and were using very loud and vulgar language demanding patient not be discharged. They offered no other history not already known and iincorporated in assessment and plan. They became very agitated and left without being willing to engage in conversation about treatment plan or have questions answered. Patient actually took this appropriately throughout.change to plan. Advise DC from hospital, continue medication, arrange followup ASAP with outpt provider. Get help ASAP if any return of suicidal ideation with any plan or intent.  Electronic Signatures: Audery Amellapacs, John T (MD)  (Signed on 28-Jan-16 12:35)  Authored  Last Updated: 28-Jan-16 12:35 by Audery Amellapacs, John T (MD)

## 2014-07-19 NOTE — Consult Note (Signed)
PATIENT NAMLyda May:  May, [name] MR#:  [id number] DATE OF BIRTH:  1960/12/23  DATE OF CONSULTATION:  04/14/2014  REFERRING PHYSICIAN:   CONSULTING PHYSICIAN:  [name], MD  IDENTIFYING INFORMATION AND REASON FOR CONSULTATION: A 54 year old woman who came into the hospital on the 19th with altered mental status and a history of probable overdose. Reconsult requested by Dr. Renae May to re-evaluate appropriate treatment plan.   HISTORY OF PRESENT ILLNESS: Information obtained from the patient and the chart. The patient was admitted to the hospital on the 19th after being found by her son with altered mental status, probably having aspirated vomit. The patient required intubation initially, but has subsequently been transferred to the medical floor. The patient tells me today that she took a large number of pain medicines as well as a full bottle night of NyQuil. She says she did this intentionally and that she had been planning it for a while. She had stockpiled old pain medicine she had around. She tells me that it was clearly a suicide attempt. Her mood had been depressed for months. She had felt negative about herself, had lot of negative thoughts about her own worth. Was feeling upset about having broken up with a boyfriend, felt unappreciated at work. She does not report having any psychotic symptoms. She denies that she was abusing any substances. She was not seeing a therapist or talking to her doctor or getting any treatment about it. The patient states that now since coming out of the critical care unit her mood is feeling good. She feels very regretful about what she did. Says that she recognizes multiple positive things in her life. Totally denies suicidal ideation. She is agreeable to discussing an appropriate treatment plan.   PAST PSYCHIATRIC HISTORY: The patient reports she has had one previous suicide attempt, probably about 30 years ago. No previous psychiatric hospitalization. She has  been treated for depression more recently. A couple of years ago she was taking a combination of Prozac and Wellbutrin prescribed by her primary care doctor and found it to be very helpful. She stopped taking it and has been off medicine for probably nearly 2 years. She does not report any manic symptoms. Does not report any history of psychosis. No history of violence.   SOCIAL HISTORY: The patient lives by herself. 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 Has 1 adult son with whom she appears to have generally good relationship, although she says that she had been feeling angry at him recently. That seems to have improved and she is planning to go live with him after she leaves the hospital. Says that she has a reasonably good social support.   PAST MEDICAL HISTORY: The patient is still recovering from respiratory failure and intubation. She is overweight. Otherwise, no significant ongoing medical problems really.   CURRENT MEDICATIONS: Right now she is getting atorvastatin 40 mg at night. She had been getting Klonopin for the last few days. Getting Decadron infusion briefly. Loperamide once, pantoprazole, potassium to supplement, albuterol for shortness of breath, mostly short-term and supportive medicine.   ALLERGIES: CODEINE.   FAMILY HISTORY: Multiple people in her family with both substance abuse and mental health issues.   SUBSTANCE ABUSE HISTORY: Denies that she drinks regularly, denies any history of drug abuse.   REVIEW OF SYSTEMS: Sore throat. Still coughing a bit. Feeling weak. Otherwise, physically feeling better. Mentally, mood remains regretful, but not depressed or sad. Totally denies suicidal ideation.   MENTAL STATUS  EXAMINATION: Neatly groomed woman, looks her stated age, interviewed in her hospital room. She requested that her adult son and his girlfriend stay in the room. Appeared to be honest and forthcoming during the interview. Good eye contact, normal psychomotor  activity. Speech normal rate, tone, and volume. Affect euthymic, although a little bit remorseful and appropriate at times. Mood stated as being better, thoughts are lucid. No loosening of associations. Denies auditory or visual hallucinations. Denies current suicidal or homicidal ideation. She is alert and oriented x 4. Short and long-term memory both appear to be intact. Judgment and insight improved. Intelligence normal.   LABORATORY RESULTS: Multiple laboratories obviously have been done. On admission, her drug screen was positive only for opiates, not benzodiazepines, which suggests that she really had only taken narcotics and not lorazepam as she had initially said. Chemistries several abnormalities, but things appear to be improving.   VITAL SIGNS: Blood pressure currently 169/73, respirations 18, pulse 95, temperature 98.1.   ASSESSMENT: A 54 year old woman who made a serious suicide attempt after an episode of untreated major depression. Right now, she appears to be honest, forthcoming, and interested in treatment. The patient is feeling much better now, but I explained to her the concept of a flight into health. She is very willing to engage in treatment. I do not think that she requires transfer to the psychiatry ward, but I do think she needs further psychiatric treatment.   TREATMENT PLAN: Counseling and supportive therapy done. The patient is agreeable to outpatient treatment. Also, I suggest we go ahead and start her back on appropriate antidepressants. Discontinue the clonazepam as unnecessary, and restart Prozac 20 mg a day and bupropion extended release 150 mg once a day. She has private health insurance and I will request social work to consult with her about mental health providers in the community and strongly encouraged her to follow up with a psychiatrist who can fine tune her antidepressant medicine. The patient is agreeable to that. She is not on involuntary commitment.    DIAGNOSIS, PRINCIPAL AND PRIMARY:  AXIS I: Major depression, severe, recurrent.   SECONDARY DIAGNOSES:  AXIS I: No further.  AXIS II: Deferred.  AXIS III: Status post respiratory arrest and opiate overdose, dyslipidemia, overweight.    ____________________________ Audery Amel, MD jtc:TT D: 04/14/2014 19:38:38 ET T: 04/14/2014 20:03:10 ET JOB#: [id number]  cc: Audery Amel, MD, <Dictator> Audery Amel MD ELECTRONICALLY SIGNED 04/29/2014 17:22

## 2014-07-19 NOTE — Consult Note (Signed)
PATIENT NAMLyda May:  May, [name] MR#:  [id number] DATE OF BIRTH:  29-Mar-1960  DATE OF CONSULTATION:  04/12/2014  REFERRING PHYSICIAN:   CONSULTING PHYSICIAN:  [name] K. [name] Kinlaw, MD  AGE: [age] years.  SEX: Female.  RACE: White.  SUBJECTIVE: The patient was seen in consultation in CCU 3. Staff reports that the patient was extubated today and was ready for consultation. According to information obtained from the staff, the patient overdosed herself on lorazepam. The patient reports that it was an accident and she took lorazepam that was prescribed to her by her physician, [place]. Laural [place], [place] [facility] [place] [facility]. The patient reports that she has been followed by Dr. Laural May for several years since [place]. 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 The patient reports that her son called for the ambulance. When she was quizzed on how her son called for ambulance she stated that she did not know how this happened.  PAST PSYCHIATRIC HISTORY: No previous history of inpatient psychiatry. No history of suicide attempts. Not being followed by a psychiatrist and gets lorazepam from her physician, not been taking it on a regular basis and she reports that it was an accident, that she took too many of those pills and does not know how this happened. The patient appears to be a poor historian.  ALCOHOL AND DRUGS: Denies drinking alcohol. Denies street or prescription drug abuse. Denies smoking nicotine cigarettes.  MENTAL STATUS: The patient was seen lying in CCU bed. The patient has been just extubated and still drowsy, but however, she is arousable and wakes up and gives information. She knows where she is and she realizes that she took too many of lorazepam pills but does not want to give the reason for the same. When she was asked about depression, she reported she is not depressed and she stated that she is  feeling fine and can go home. Does not appear to be responding to internal stimuli. Denies feeling hopeless or helpless, worthless or useless. Denies any suicidal, homicidal ideas or plans and repeatedly stated that she does not know how this accidental overdose of lorazepam happened. Insight and judgment guarded. Impulse control poor.  IMPRESSION: Mood disorder, unspecified, rule out major depressive disorder with suicide attempt and further history and information should be obtained from the family members, probably her son who called for help and called for ambulance. Social services is to contact Dr. Henriette May's office to know further details about the patient.  RECOMMENDATIONS: Reconsultation of the patient for consideration for transfer to behavioral health unit when she is medically cleared and stable by tomorrow, 04/13/2014.   ____________________________ [name] MantisSurya K. Guss Bundehalla, MD skc:TT D: 04/12/2014 14:03:20 ET T: 04/12/2014 19:34:54 ET JOB#: [id number]  cc: Monika SalkSurya K. Guss Bundehalla, MD, <Dictator> [name] FannySURYA K Tamir Wallman MD ELECTRONICALLY SIGNED 04/18/2014 11:59

## 2016-03-25 IMAGING — CR DG CHEST 1V PORT
1 series · 1 of 1 positions shown · non-contrast
Comparison: 04/07/2014

CLINICAL DATA: Post intubation

EXAM:
PORTABLE CHEST - 1 VIEW

[ap]
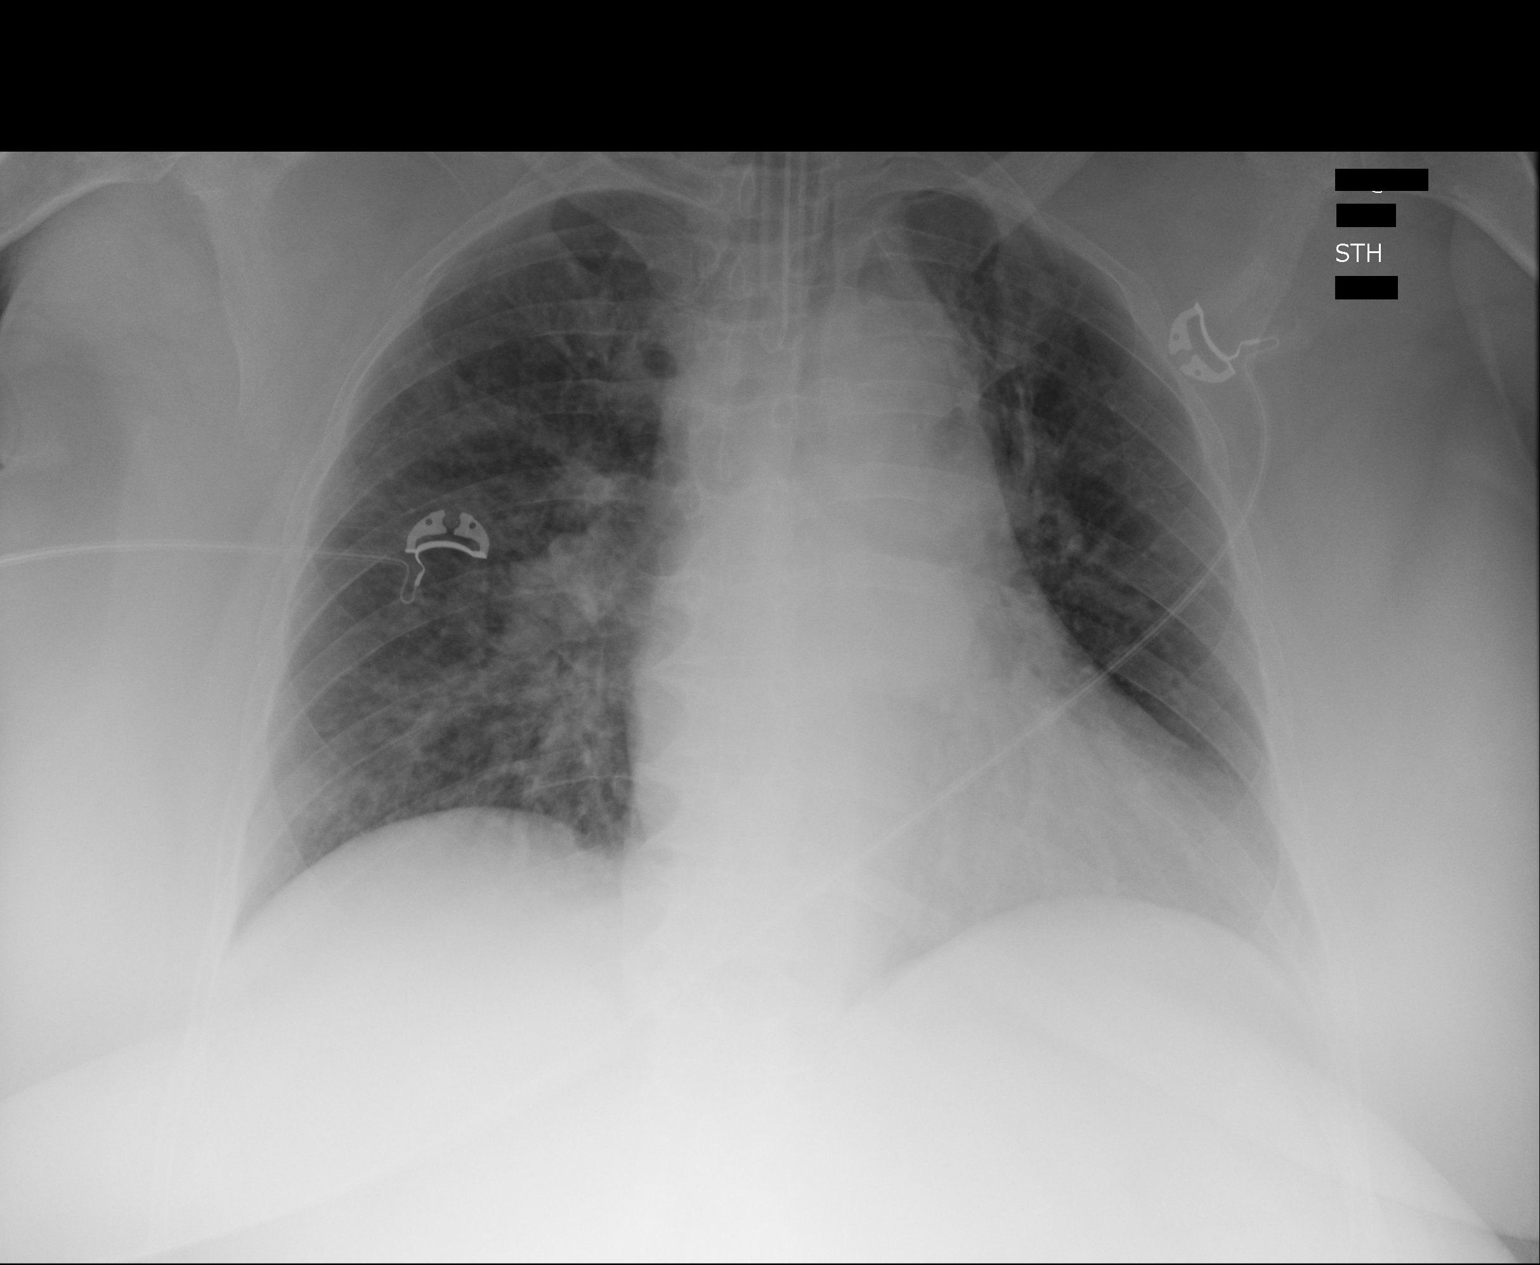

[1 of 1 positions shown; findings below may reference images not displayed]

FINDINGS: Interval placement of endotracheal tube in good position. Improved
lung volume.

Improved aeration in the lung bases. Improvement with persistent
asymmetric density overlying the right hilum could be atelectasis or
infiltrate however mass lesion not excluded. Prominent superior
mediastinum with ill-defined aortic arch may be projection versus
adenopathy. No pleural effusion.
IMPRESSION: Satisfactory endotracheal tube position with improved lung volume
and improved aeration of the lung bases.

Improvement in right medial base density however persistent
asymmetric density persists in this area which could be infiltrate
or mass lesion.

## 2016-03-25 IMAGING — CR DG ABDOMEN 1V
1 series · 1 of 1 positions shown · non-contrast
Comparison: None.

CLINICAL DATA: Status post nasogastric catheter placement

EXAM:
ABDOMEN - 1 VIEW

[supine kub]
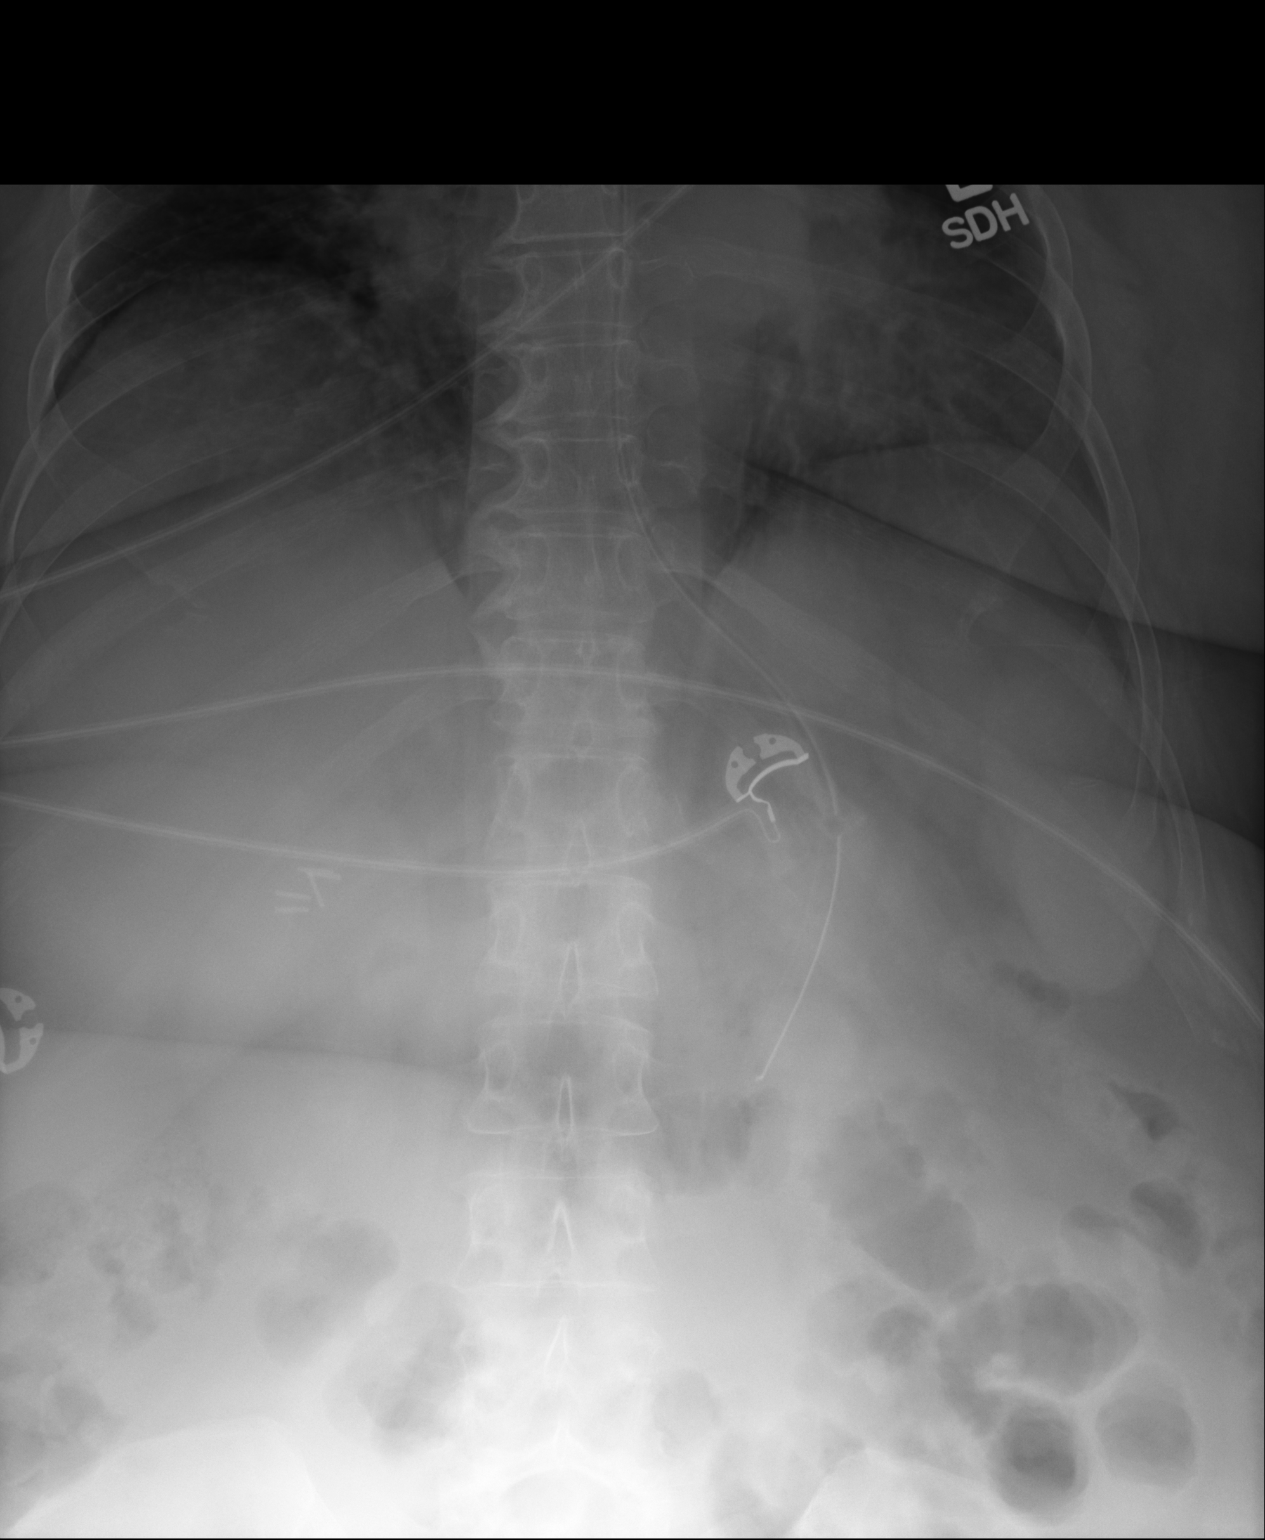

[1 of 1 positions shown; findings below may reference images not displayed]

FINDINGS: Scattered large and small bowel gas is noted. A nasogastric catheter
is noted extending into the stomach. No acute abnormality is seen.
IMPRESSION: Nasogastric catheter within the stomach.

## 2016-03-26 IMAGING — CR DG CHEST 1V PORT
1 series · 1 of 1 positions shown · non-contrast
Comparison: 04/07/2014

CLINICAL DATA: Pneumonia and respiratory failure.

EXAM:
PORTABLE CHEST - 1 VIEW

[ap]
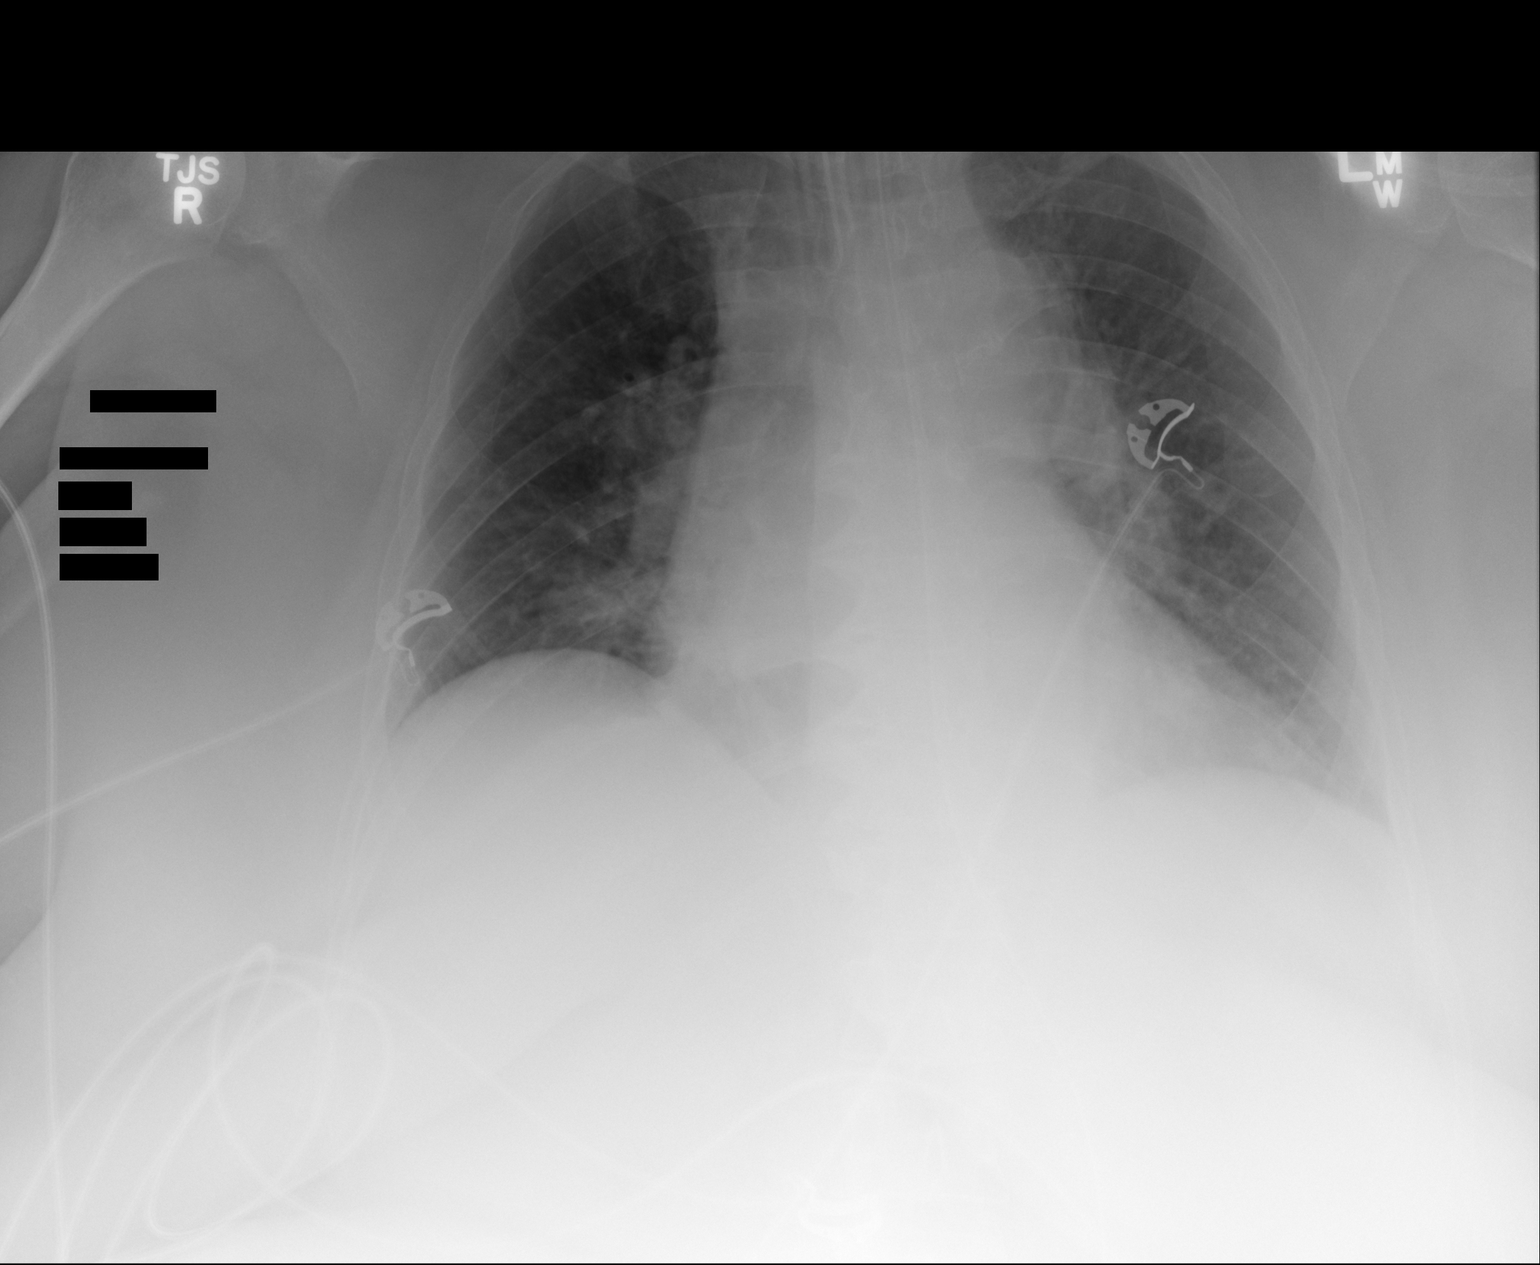

[1 of 1 positions shown; findings below may reference images not displayed]

FINDINGS: Endotracheal tube tip is approximately 2.5 cm above the carina.
Nasogastric tube extends into the stomach. The lungs show low
bilateral volumes with bibasilar atelectasis. No overt edema or
focal airspace consolidation. The heart size and mediastinal
contours are stable.
IMPRESSION: Low lung volumes with bibasilar atelectasis.

## 2016-03-27 IMAGING — CT CT HEAD WITHOUT CONTRAST
1 of 2 series · 13 of 30 positions shown, 17 images · non-contrast
Comparison: CT scan of April 07, 2014.

CLINICAL DATA: Altered mental status.

EXAM:
CT HEAD WITHOUT CONTRAST
TECHNIQUE: Contiguous axial images were obtained from the base of the skull
through the vertex without intravenous contrast.

[Series 2: head wo · axial · 0.43mm/px · z∈[-102,+38]mm · 13 of 34 slices shown, 17 images]
[im 3/34  brain]
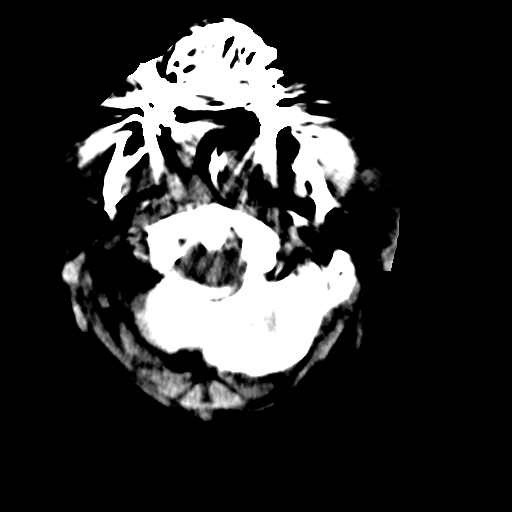
[im 3/34  bone]
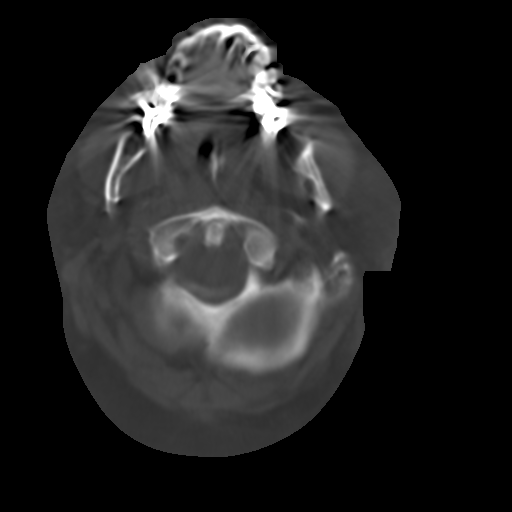
[im 5/34  brain]
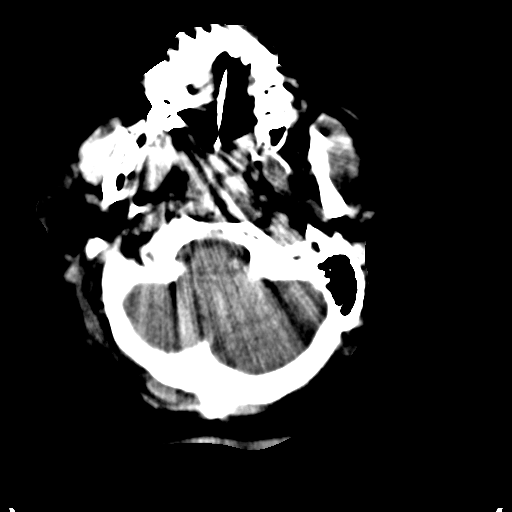
[im 8/34  brain]
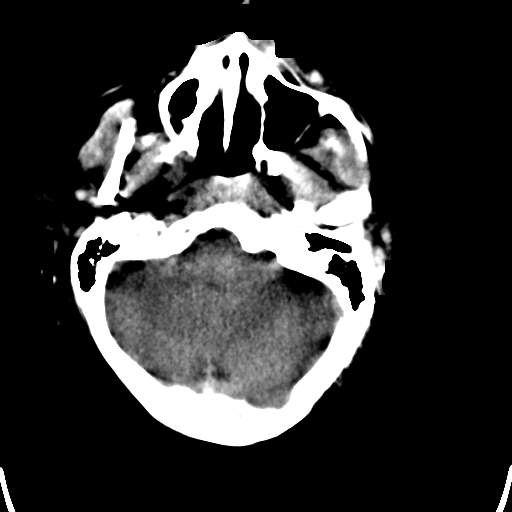
[im 10/34  brain]
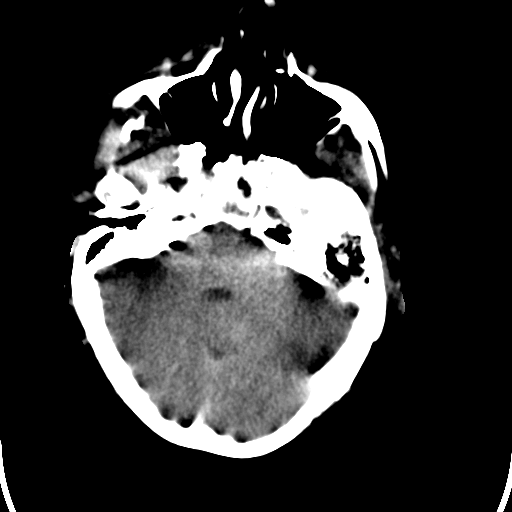
[im 12/34  brain]
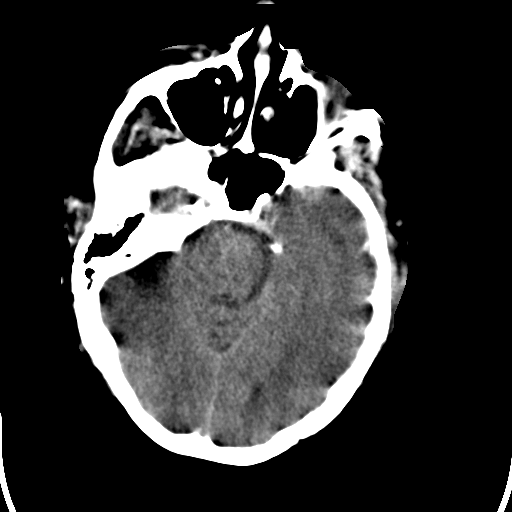
[im 12/34  bone]
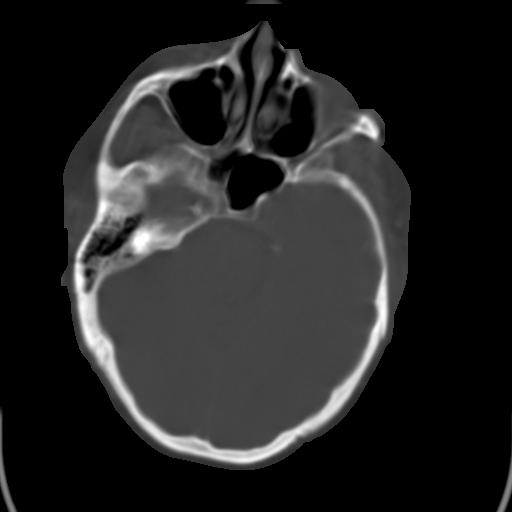
[im 15/34  brain]
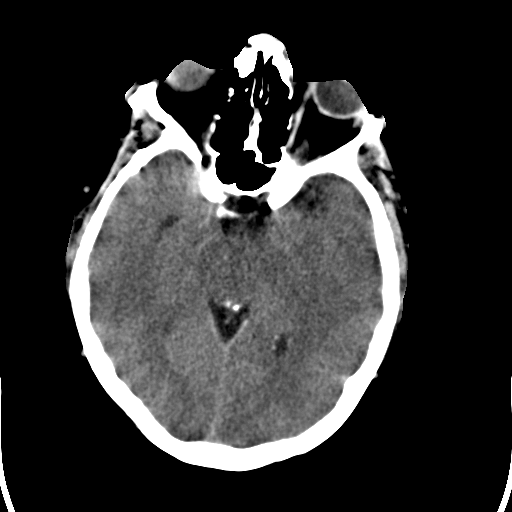
[im 17/34  brain]
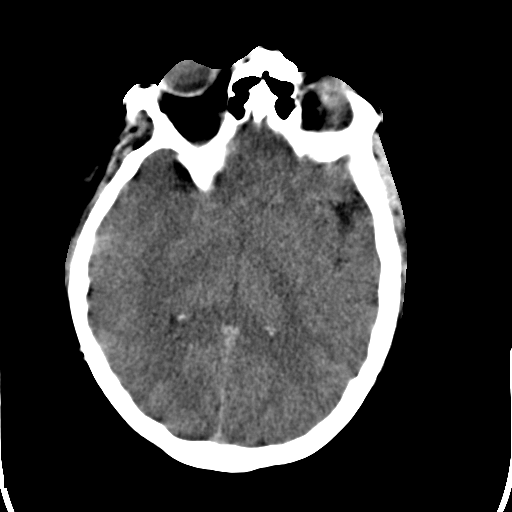
[im 19/34  brain]
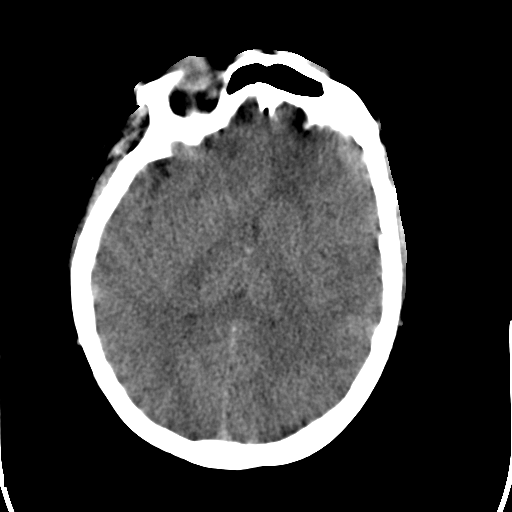
[im 22/34  brain]
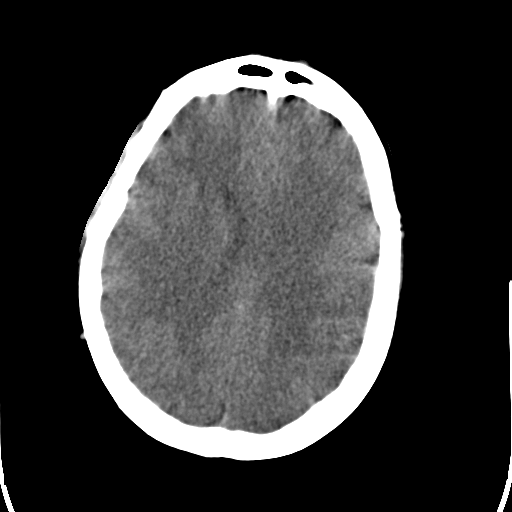
[im 22/34  bone]
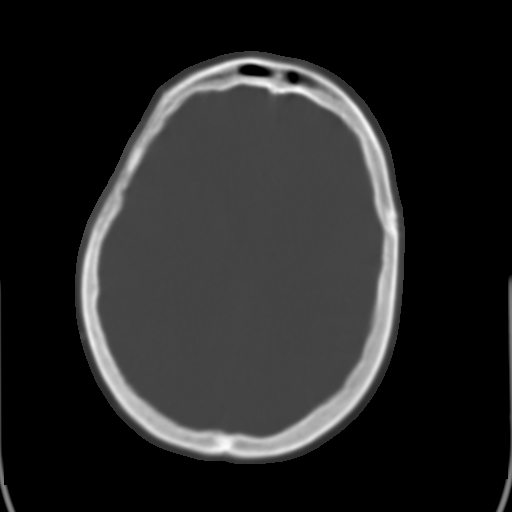
[im 24/34  brain]
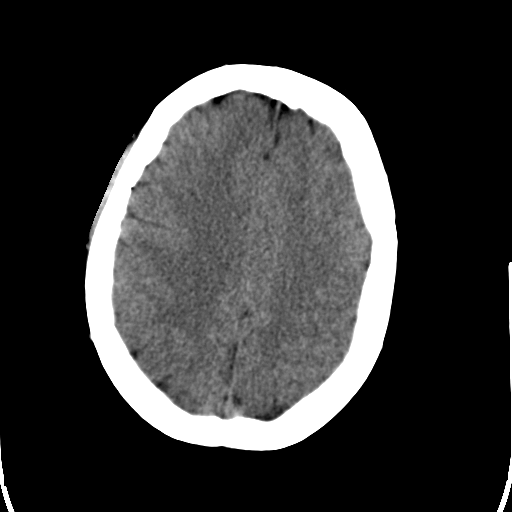
[im 26/34  brain]
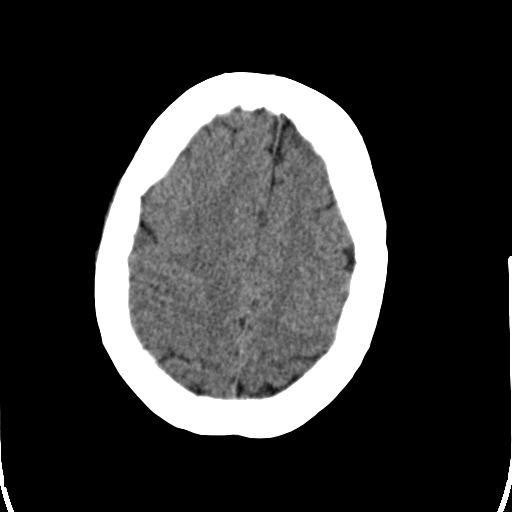
[im 29/34  brain]
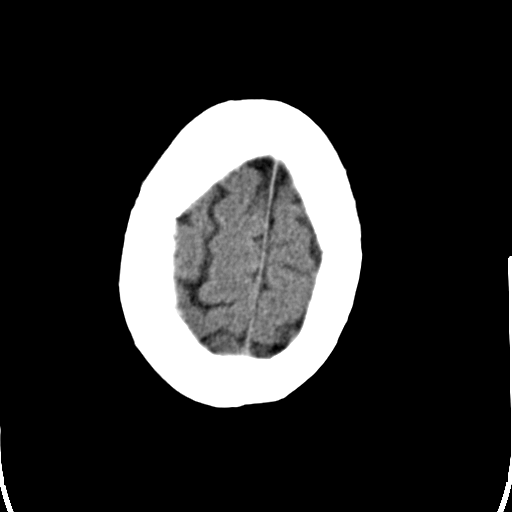
[im 31/34  brain]
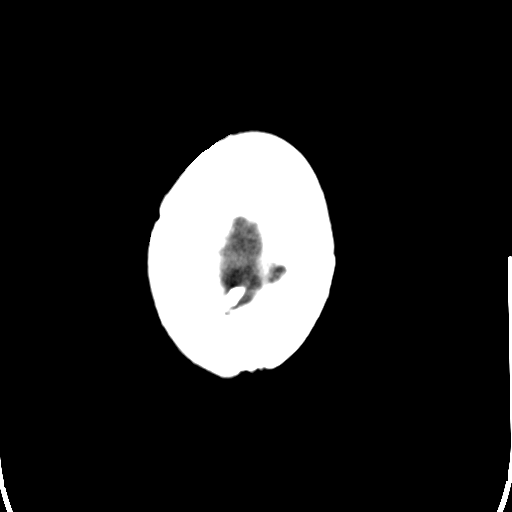
[im 31/34  bone]
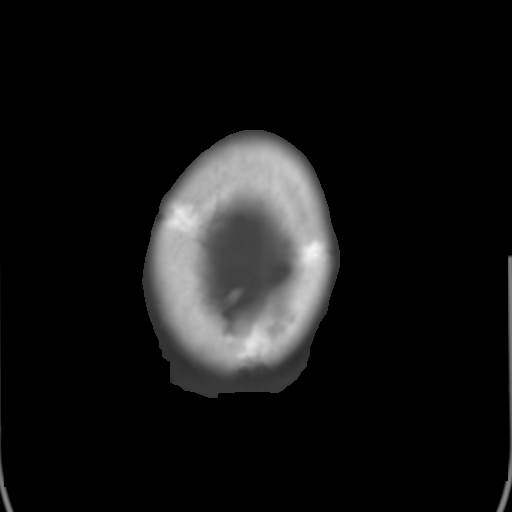

[13 of 30 positions shown; findings below may reference images not displayed]

FINDINGS: Bony calvarium appears intact. Ill-defined low density is seen
involving the inferior portion of the right frontal lobe concerning
for acute infarction. Ventricular size is within normal limits. No
mass effect or midline shift is noted. There is no evidence of
hemorrhage or mass lesion.
IMPRESSION: New ill-defined low density seen involving the inferior portion of
the right frontal lobe concerning for acute infarction. MRI may be
performed for further evaluation.
